# Patient Record
Sex: Male | Born: 1980 | Race: White | Hispanic: No
Health system: Midwestern US, Community
[De-identification: ages and names within clinical notes are randomized; demographics above are authoritative.]

## PROBLEM LIST (undated history)

## (undated) DIAGNOSIS — F319 Bipolar disorder, unspecified: Secondary | ICD-10-CM

## (undated) DIAGNOSIS — G8929 Other chronic pain: Secondary | ICD-10-CM

## (undated) DIAGNOSIS — F419 Anxiety disorder, unspecified: Secondary | ICD-10-CM

## (undated) DIAGNOSIS — M549 Dorsalgia, unspecified: Secondary | ICD-10-CM

---

## 2014-03-14 ENCOUNTER — Inpatient Hospital Stay: Admit: 2014-03-14 | Discharge: 2014-03-14 | Discharge status: Against Medical Advice

## 2014-03-14 NOTE — ED Notes (Signed)
Pt presents to ED with c/o lower back pain. Pt states that he has 3 bulging discs in lower back and needs to have surgery but can not afford to be off work to have it done.     Stefano GaulLarry Seairra Otani, RN  03/14/14 [phone]

## 2014-07-22 ENCOUNTER — Encounter (HOSPITAL_BASED_OUTPATIENT_CLINIC_OR_DEPARTMENT_OTHER): Payer: Self-pay | Admitting: *Deleted

## 2014-07-22 ENCOUNTER — Emergency Department (HOSPITAL_BASED_OUTPATIENT_CLINIC_OR_DEPARTMENT_OTHER): Payer: Self-pay

## 2014-07-22 ENCOUNTER — Emergency Department (HOSPITAL_BASED_OUTPATIENT_CLINIC_OR_DEPARTMENT_OTHER)
Admission: EM | Admit: 2014-07-22 | Discharge: 2014-07-22 | Disposition: A | Discharge status: Home / Self Care | Payer: Self-pay | Attending: Emergency Medicine | Admitting: Emergency Medicine

## 2014-07-22 DIAGNOSIS — Z79899 Other long term (current) drug therapy: Secondary | ICD-10-CM | POA: Insufficient documentation

## 2014-07-22 DIAGNOSIS — F319 Bipolar disorder, unspecified: Secondary | ICD-10-CM | POA: Insufficient documentation

## 2014-07-22 DIAGNOSIS — Z23 Encounter for immunization: Secondary | ICD-10-CM | POA: Insufficient documentation

## 2014-07-22 DIAGNOSIS — Y9389 Activity, other specified: Secondary | ICD-10-CM | POA: Insufficient documentation

## 2014-07-22 DIAGNOSIS — T148XXA Other injury of unspecified body region, initial encounter: Secondary | ICD-10-CM

## 2014-07-22 DIAGNOSIS — Y998 Other external cause status: Secondary | ICD-10-CM | POA: Insufficient documentation

## 2014-07-22 DIAGNOSIS — S060X9A Concussion with loss of consciousness of unspecified duration, initial encounter: Secondary | ICD-10-CM | POA: Insufficient documentation

## 2014-07-22 DIAGNOSIS — Y9289 Other specified places as the place of occurrence of the external cause: Secondary | ICD-10-CM | POA: Insufficient documentation

## 2014-07-22 DIAGNOSIS — F419 Anxiety disorder, unspecified: Secondary | ICD-10-CM | POA: Insufficient documentation

## 2014-07-22 DIAGNOSIS — S299XXA Unspecified injury of thorax, initial encounter: Secondary | ICD-10-CM | POA: Insufficient documentation

## 2014-07-22 DIAGNOSIS — G8929 Other chronic pain: Secondary | ICD-10-CM | POA: Insufficient documentation

## 2014-07-22 DIAGNOSIS — S0081XA Abrasion of other part of head, initial encounter: Secondary | ICD-10-CM | POA: Insufficient documentation

## 2014-07-22 HISTORY — DX: Other chronic pain: G89.29

## 2014-07-22 HISTORY — DX: Dorsalgia, unspecified: M54.9

## 2014-07-22 HISTORY — DX: Bipolar disorder, unspecified: F31.9

## 2014-07-22 HISTORY — DX: Anxiety disorder, unspecified: F41.9

## 2014-07-22 LAB — CBC WITH DIFFERENTIAL/PLATELET
Basophils Absolute: 0.1 K/uL (ref 0.0–0.1)
Basophils Relative: 1 % (ref 0–1)
Eosinophils Absolute: 0.4 K/uL (ref 0.0–0.7)
Eosinophils Relative: 5 % (ref 0–5)
HCT: 39.6 % (ref 39.0–52.0)
Hemoglobin: 12.9 g/dL — ABNORMAL LOW (ref 13.0–17.0)
Lymphocytes Relative: 14 % (ref 12–46)
Lymphs Abs: 1 K/uL (ref 0.7–4.0)
MCH: 28.5 pg (ref 26.0–34.0)
MCHC: 32.6 g/dL (ref 30.0–36.0)
MCV: 87.6 fL (ref 78.0–100.0)
Monocytes Absolute: 0.3 K/uL (ref 0.1–1.0)
Monocytes Relative: 5 % (ref 3–12)
Neutro Abs: 5.6 K/uL (ref 1.7–7.7)
Neutrophils Relative %: 75 % (ref 43–77)
Platelets: 262 K/uL (ref 150–400)
RBC: 4.52 MIL/uL (ref 4.22–5.81)
RDW: 13.5 % (ref 11.5–15.5)
WBC: 7.4 K/uL (ref 4.0–10.5)

## 2014-07-22 LAB — BASIC METABOLIC PANEL WITH GFR
Anion gap: 7 (ref 5–15)
BUN: 13 mg/dL (ref 6–20)
CO2: 28 mmol/L (ref 22–32)
Calcium: 9.1 mg/dL (ref 8.9–10.3)
Chloride: 103 mmol/L (ref 101–111)
Creatinine, Ser: 0.96 mg/dL (ref 0.61–1.24)
GFR calc Af Amer: >60 mL/min
GFR calc non Af Amer: >60 mL/min
Glucose, Bld: 94 mg/dL (ref 65–99)
Potassium: 4.3 mmol/L (ref 3.5–5.1)
Sodium: 138 mmol/L (ref 135–145)

## 2014-07-22 MED ORDER — HYDROMORPHONE HCL 1 MG/ML IJ SOLN
1.0000 mg | Freq: Once | INTRAMUSCULAR | Status: AC
  Administered 2014-07-22: 1 mg via INTRAVENOUS
  Filled 2014-07-22: qty 1

## 2014-07-22 MED ORDER — TETANUS-DIPHTH-ACELL PERTUSSIS 5-2.5-18.5 LF-MCG/0.5 IM SUSP
0.5000 mL | Freq: Once | INTRAMUSCULAR | Status: DC
  Filled 2014-07-22: qty 0.5

## 2014-07-22 NOTE — Discharge Instructions (Signed)
Assault, General °Assault includes any behavior, whether intentional or reckless, which results in bodily injury to another person and/or damage to property. Included in this would be any behavior, intentional or reckless, that by its nature would be understood (interpreted) by a reasonable person as intent to harm another person or to damage his/her property. Threats may be oral or written. They may be communicated through regular mail, computer, fax, or phone. These threats may be direct or implied. °FORMS OF ASSAULT INCLUDE: °· Physically assaulting a person. This includes physical threats to inflict physical harm as well as: °· Slapping. °· Hitting. °· Poking. °· Kicking. °· Punching. °· Pushing. °· Arson. °· Sabotage. °· Equipment vandalism. °· Damaging or destroying property. °· Throwing or hitting objects. °· Displaying a weapon or an object that appears to be a weapon in a threatening manner. °· Carrying a firearm of any kind. °· Using a weapon to harm someone. °· Using greater physical size/strength to intimidate another. °· Making intimidating or threatening gestures. °· Bullying. °· Hazing. °· Intimidating, threatening, hostile, or abusive language directed toward another person. °· It communicates the intention to engage in violence against that person. And it leads a reasonable person to expect that violent behavior may occur. °· Stalking another person. °IF IT HAPPENS AGAIN: °· Immediately call for emergency help (911 in [place] °· If someone poses clear and immediate danger to you, seek legal authorities to have a protective or restraining order put in place. °· Less threatening assaults can at least be reported to authorities. °STEPS TO TAKE IF A SEXUAL ASSAULT HAS HAPPENED °· Go to an area of safety. This may include a shelter or staying with a friend. Stay away from the area where you have been attacked. A large percentage of sexual assaults are caused by a friend, relative or associate. °· If  medications were given by your caregiver, take them as directed for the full length of time prescribed. °· Only take over-the-counter or prescription medicines for pain, discomfort, or fever as directed by your caregiver. °· If you have come in contact with a sexual disease, find out if you are to be tested again. If your caregiver is concerned about the HIV/AIDS virus, he/she may require you to have continued testing for several months. °· For the protection of your privacy, test results can not be given over the phone. Make sure you receive the results of your test. If your test results are not back during your visit, make an appointment with your caregiver to find out the results. Do not assume everything is normal if you have not heard from your caregiver or the medical facility. It is important for you to follow up on all of your test results. °· File appropriate papers with authorities. This is important in all assaults, even if it has occurred in a family or by a friend. °SEEK MEDICAL CARE IF: °· You have new problems because of your injuries. °· You have problems that may be because of the medicine you are taking, such as: °· Rash. °· Itching. °· Swelling. °· Trouble breathing. °· You develop belly (abdominal) pain, feel sick to your stomach (nausea) or are vomiting. °· You begin to run a temperature. °· You need supportive care or referral to a rape crisis center. These are centers with trained personnel who can help you get through this ordeal. °SEEK IMMEDIATE MEDICAL CARE IF: °· You are afraid of being threatened, beaten, or abused. In [place] call 911. °· You   receive new injuries related to abuse. °· You develop severe pain in any area injured in the assault or have any change in your condition that concerns you. °· You faint or lose consciousness. °· You develop chest pain or shortness of breath. °Document Released: 02/18/2005 Document Revised: 05/13/2011 Document Reviewed: 10/07/2007 °[employer]® Patient  Information ©2015 [employer]. This information is not intended to replace advice given to you by your health care provider. Make sure you discuss any questions you have with your health care provider. ° °Contusion °A contusion is a deep bruise. Contusions are the result of an injury that caused bleeding under the skin. The contusion may turn blue, purple, or yellow. Minor injuries will give you a painless contusion, but more severe contusions may stay painful and swollen for a few weeks.  °CAUSES  °A contusion is usually caused by a blow, trauma, or direct force to an area of the body. °SYMPTOMS  °· Swelling and redness of the injured area. °· Bruising of the injured area. °· Tenderness and soreness of the injured area. °· Pain. °DIAGNOSIS  °The diagnosis can be made by taking a history and physical exam. An X-ray, CT scan, or MRI may be needed to determine if there were any associated injuries, such as fractures. °TREATMENT  °Specific treatment will depend on what area of the body was injured. In general, the best treatment for a contusion is resting, icing, elevating, and applying cold compresses to the injured area. Over-the-counter medicines may also be recommended for pain control. Ask your caregiver what the best treatment is for your contusion. °HOME CARE INSTRUCTIONS  °· Put ice on the injured area. °¨ Put ice in a plastic bag. °¨ Place a towel between your skin and the bag. °¨ Leave the ice on for 15-20 minutes, 3-4 times a day, or as directed by your health care provider. °· Only take over-the-counter or prescription medicines for pain, discomfort, or fever as directed by your caregiver. Your caregiver may recommend avoiding anti-inflammatory medicines (aspirin, ibuprofen, and naproxen) for 48 hours because these medicines may increase bruising. °· Rest the injured area. °· If possible, elevate the injured area to reduce swelling. °SEEK IMMEDIATE MEDICAL CARE IF:  °· You have increased bruising or  swelling. °· You have pain that is getting worse. °· Your swelling or pain is not relieved with medicines. °MAKE SURE YOU:  °· Understand these instructions. °· Will watch your condition. °· Will get help right away if you are not doing well or get worse. °Document Released: 11/28/2004 Document Revised: 02/23/2013 Document Reviewed: 12/24/2010 °[employer]® Patient Information ©2015 [employer]. This information is not intended to replace advice given to you by your health care provider. Make sure you discuss any questions you have with your health care provider. ° °

## 2014-07-22 NOTE — ED Notes (Signed)
Pt sleeping with resp deep and reg. Arouses to spoken name, reports his pain cont 10/10. Pt sleeping with resp deep and reg while being medicated. Awakens to spoken name, verbalizes understanding of need for urine specimen. Urinal given to pt, will alert staff when he can void. Pt goes right back to sleep with resp deep and reg.

## 2014-07-22 NOTE — ED Notes (Signed)
Scott, security, notified of pt awaiting his ride in the waiting room.
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
## 2014-07-22 NOTE — ED Notes (Signed)
Pt returned from radiology.  Urine specimen requested.
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
## 2014-07-22 NOTE — ED Notes (Signed)
Urinal provided and pt informed that a urine specimen is needed.

## 2014-07-22 NOTE — ED Notes (Signed)
Patient transported to X-ray 
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
## 2014-07-23 ENCOUNTER — Encounter (HOSPITAL_BASED_OUTPATIENT_CLINIC_OR_DEPARTMENT_OTHER): Payer: Self-pay

## 2014-07-23 ENCOUNTER — Emergency Department (HOSPITAL_BASED_OUTPATIENT_CLINIC_OR_DEPARTMENT_OTHER)
Admission: EM | Admit: 2014-07-23 | Discharge: 2014-07-23 | Disposition: A | Discharge status: Home / Self Care | Payer: Self-pay | Attending: Emergency Medicine | Admitting: Emergency Medicine

## 2014-07-23 DIAGNOSIS — M7918 Myalgia, other site: Secondary | ICD-10-CM

## 2014-07-23 DIAGNOSIS — M791 Myalgia: Secondary | ICD-10-CM | POA: Insufficient documentation

## 2014-07-23 DIAGNOSIS — F0781 Postconcussional syndrome: Secondary | ICD-10-CM | POA: Insufficient documentation

## 2014-07-23 DIAGNOSIS — F419 Anxiety disorder, unspecified: Secondary | ICD-10-CM | POA: Insufficient documentation

## 2014-07-23 DIAGNOSIS — S0083XD Contusion of other part of head, subsequent encounter: Secondary | ICD-10-CM | POA: Insufficient documentation

## 2014-07-23 DIAGNOSIS — T148XXA Other injury of unspecified body region, initial encounter: Secondary | ICD-10-CM

## 2014-07-23 DIAGNOSIS — Z79899 Other long term (current) drug therapy: Secondary | ICD-10-CM | POA: Insufficient documentation

## 2014-07-23 DIAGNOSIS — G8929 Other chronic pain: Secondary | ICD-10-CM | POA: Insufficient documentation

## 2014-07-23 MED ORDER — ACETAMINOPHEN 325 MG PO TABS
650.0000 mg | ORAL_TABLET | Freq: Once | ORAL | Status: AC
  Administered 2014-07-23: 650 mg via ORAL
  Filled 2014-07-23: qty 2

## 2014-07-23 MED ORDER — CYCLOBENZAPRINE HCL 10 MG PO TABS: 10.0000 mg | ORAL_TABLET | Freq: Two times a day (BID) | ORAL | Status: AC | PRN

## 2014-07-23 NOTE — Discharge Instructions (Signed)
For pain control you may take up to 800mg  of ibuprofen (that is usually 4 over the counter pills)  3 times a day (take with food) and acetaminophen 975mg  (this is 3 over the counter pills) four times a day. Do not drink alcohol or combine with other medications that have acetaminophen as an ingredient (Read the labels!).    For breakthrough pain you may take Flexeril. Do not drink alcohol, drive or operate heavy machinery when taking Flexeril.  Do not participate in any sports or any activities that could result in head trauma until you are cleared by your pediatrician,  primary care physician or neurologist.   Do not hesitate to return to the emergency room for any new, worsening or concerning symptoms.  Please obtain primary care using resource guide below. Let them know that you were seen in the emergency room and that they will need to obtain records for further outpatient management.   Concussion A concussion is a brain injury. It is caused by:  A hit to the head.  A quick and sudden movement (jolt) of the head or neck. A concussion is usually not life threatening. Even so, it can cause serious problems. If you had a concussion before, you may have concussion-like problems after a hit to your head. HOME CARE General Instructions  Follow your doctor's directions carefully.  Take medicines only as told by your doctor.  Only take medicines your doctor says are safe.  Do not drink alcohol until your doctor says it is okay. Alcohol and some drugs can slow down healing. They can also put you at risk for further injury.  If you are having trouble remembering things, write them down.  Try to do one thing at a time if you get distracted easily. For example, do not watch TV while making dinner.  Talk to your family members or close friends when making important decisions.  Follow up with your doctor as told.  Watch your symptoms. Tell others to do the same. Serious problems can  sometimes happen after a concussion. Older adults are more likely to have these problems.  Tell your teachers, school nurse, school counselor, coach, Event organiserathletic trainer, or work Production designer, theatre/television/filmmanager about your concussion. Tell them about what you can or cannot do. They should watch to see if:  It gets even harder for you to pay attention or concentrate.  It gets even harder for you to remember things or learn new things.  You need more time than normal to finish things.  You become annoyed (irritable) more than before.  You are not able to deal with stress as well.  You have more problems than before.  Rest. Make sure you:  Get plenty of sleep at night.  Go to sleep early.  Go to bed at the same time every day. Try to wake up at the same time.  Rest during the day.  Take naps when you feel tired.  Limit activities where you have to think a lot or concentrate. These include:  Doing homework.  Doing work related to a job.  Watching TV.  Using the computer. Returning To Your Regular Activities Return to your normal activities slowly, not all at once. You must give your body and brain enough time to heal.   Do not play sports or do other athletic activities until your doctor says it is okay.  Ask your doctor when you can drive, ride a bicycle, or work other vehicles or machines. Never do these things  if you feel dizzy.  Ask your doctor about when you can return to work or school. Preventing Another Concussion It is very important to avoid another brain injury, especially before you have healed. In rare cases, another injury can lead to permanent brain damage, brain swelling, or death. The risk of this is greatest during the first 7-10 days after your injury. Avoid injuries by:   Wearing a seat belt when riding in a car.  Not drinking too much alcohol.  Avoiding activities that could lead to a second concussion (such as contact sports).  Wearing a helmet when doing activities  like:  Biking.  Skiing.  Skateboarding.  Skating.  Making your home safer by:  Removing things from the floor or stairways that could make you trip.  Using grab bars in bathrooms and handrails by stairs.  Placing non-slip mats on floors and in bathtubs.  Improve lighting in dark areas. GET HELP IF:  It gets even harder for you to pay attention or concentrate.  It gets even harder for you to remember things or learn new things.  You need more time than normal to finish things.  You become annoyed (irritable) more than before.  You are not able to deal with stress as well.  You have more problems than before.  You have problems keeping your balance.  You are not able to react quickly when you should. Get help if you have any of these problems for more than 2 weeks:   Lasting (chronic) headaches.  Dizziness or trouble balancing.  Feeling sick to your stomach (nausea).  Seeing (vision) problems.  Being affected by noises or light more than normal.  Feeling sad, low, down in the dumps, blue, gloomy, or empty (depressed).  Mood changes (mood swings).  Feeling of fear or nervousness about what may happen (anxiety).  Feeling annoyed.  Memory problems.  Problems concentrating or paying attention.  Sleep problems.  Feeling tired all the time. GET HELP RIGHT AWAY IF:   You have bad headaches or your headaches get worse.  You have weakness (even if it is in one hand, leg, or part of the face).  You have loss of feeling (numbness).  You feel off balance.  You keep throwing up (vomiting).  You feel tired.  One black center of your eye (pupil) is larger than the other.  You twitch or shake violently (convulse).  Your speech is not clear (slurred).  You are more confused, easily angered (agitated), or annoyed than before.  You have more trouble resting than before.  You are unable to recognize people or places.  You have neck pain.  It is  difficult to wake you up.  You have unusual behavior changes.  You pass out (lose consciousness). MAKE SURE YOU:   Understand these instructions.  Will watch your condition.  Will get help right away if you are not doing well or get worse. Document Released: 02/06/2009 Document Revised: 07/05/2013 Document Reviewed: 09/10/2012 [facility] Patient Information 2015 [place], [place]. This information is not intended to replace advice given to you by your health care provider. Make sure you discuss any questions you have with your health care provider.   Emergency Department Resource Guide 1) Find a Doctor and Pay Out of Pocket Although you won't have to find out who is covered by your insurance plan, it is a good idea to ask around and get recommendations. You will then need to call the office and see if the doctor you have  chosen will accept you as a new patient and what types of options they offer for patients who are self-pay. Some doctors offer discounts or will set up payment plans for their patients who do not have insurance, but you will need to ask so you aren't surprised when you get to your appointment.  2) Contact Your Local Health Department Not all health departments have doctors that can see patients for sick visits, but many do, so it is worth a call to see if yours does. If you don't know where your local health department is, you can check in your phone book. The CDC also has a tool to help you locate your state's health department, and many state websites also have listings of all of their local health departments.  3) Find a Walk-in Clinic If your illness is not likely to be very severe or complicated, you may want to try a walk in clinic. These are popping up all over the country in pharmacies, drugstores, and shopping centers. They're usually staffed by nurse practitioners or physician assistants that have been trained to treat common illnesses and complaints. They're usually  fairly quick and inexpensive. However, if you have serious medical issues or chronic medical problems, these are probably not your best option.  No Primary Care Doctor: - Call Health Connect at  [phone] - they can help you locate a primary care doctor that  accepts your insurance, provides certain services, etc. - Physician Referral Service- [phone]  Chronic Pain Problems: Organization         Address  Phone   Notes  [facility]  [phone] Patients need to be referred by their primary care doctor.   Medication Assistance: Organization         Address  Phone   Notes  [facility] Medication [facility] [address] [place] [place], [place] [address] [phone] --Must be a resident of [facility] [place] -- Must have NO insurance coverage whatsoever (no Medicaid/ Medicare, etc.) -- The pt. MUST have a primary care doctor that directs their care regularly and follows them in the community   [facility]  [phone]   [employer]  [phone]    Agencies that provide inexpensive medical care: Organization         Address  Phone   Notes  [facility]  [phone]   [facility]    [phone]   [facility] [address] [place], [place] [address] [phone]   [facility] [address] [place] [place] [phone]   Planned Parenthood    [phone]   [facility]    [phone]   [facility]  [address], [place] Phone:  [phone], Fax:  [phone] Hours of Operation:  9 am - 6 pm, M-F.  Also accepts Medicaid/Medicare and self-pay.  [facility]  [address], [place] Phone: [phone], Fax: [phone]. Hours of Operation:  8:30 am - 5:30 pm, M-F.  Also accepts Medicaid and self-pay.  [facility] [place] [address] [place] Phone: [phone]   [facility] [address] Natasha Bence [place], [place] [phone], Ext. [phone] Mondays & Thursdays: 7-9 AM.  First 15 patients are seen on a first come, first serve basis.    Medicaid-accepting [facility] Providers:  Organization  Address  Phone   Notes  [facility] [address], [place] [phone] Also accepts self-pay patients.  [facility] [address] Laurell Josephs [place], [place]  [phone]   [facility] [address], [place] [phone]   [facility] [address], [place] [phone]   Renaye Rakers [address], [place]   [phone] Only accepts [place] Access [place] patients after they have their name applied to their card.   Self-Pay (no insurance) in [facility]:  Organization         Address  Phone   Notes  Sickle Cell Patients, [facility] [address] [place], [place] [phone]   [facility] [address] [place], [place] [phone]   [facility]  [address], [place] [phone]   [facility]/Dr. Osei-Bonsu  [address], [place] or [address], [place] [phone] Phone number for both [place] and [place] locations is the same.  [facility]gent [facility] and [facility] [address], [place] [phone]   [facility] [address], [place] or [address] [phone]   [facility] [address], [place] [phone], phone; [phone], fax Sees patients 1st and 3rd Saturday of every month.  Must not qualify for public or private insurance (i.e. Medicaid, Medicare, [place] Health Choice, Veterans' Benefits)  Household income should be no more than 200% of the poverty level The clinic cannot treat you if  you are pregnant or think you are pregnant  Sexually transmitted diseases are not treated at the clinic.    Dental Care: Organization         Address  Phone  Notes  [facility] Department of [facility] [address] [place], [place] [phone] Accepts children up to age 50 who are enrolled in [place] or [place] Health Choice; pregnant women with a Medicaid card; and children who have applied for Medicaid or [place] Health Choice, but were declined, whose parents can pay a reduced fee at time of service.  [facility] Department of [facility]  [address], [place] [phone] Accepts children up to age 40 who are enrolled in [place] or [place] Health Choice; pregnant women with a Medicaid card; and children who have applied for Medicaid or  Health Choice, but were declined, whose parents can pay a reduced fee at time of service.  [facility]  [address] [place], [place] [phone] Patients are seen by appointment only. Walk-ins are not accepted. [facility] will see patients 5 years of age and older. Monday - Tuesday (8am-5pm) Most Wednesdays (8:30-5pm) $30 per visit, cash only  [facility] Adult [facility] Access [facility]  [address], [facility] [phone] Patients are seen by appointment only. Walk-ins are not accepted. [facility] will see patients 81 years of age and older. One Wednesday Evening (Monthly: Volunteer Based).  $30 per visit, cash only  [employer]  [phone] for adults; Children under age 73, call Graduate Pediatric Dentistry at [phone]. Children aged 49-14, please call [phone] to request a pediatric application.  Dental services are provided in all areas of dental care including fillings,  crowns and bridges, complete and partial dentures, implants, gum treatment, root canals, and extractions. Preventive care is also provided. Treatment is  provided to both adults and children. Patients are selected via a lottery and there is often a waiting list.   [facility] [address], [place]  [phone] [email or web address]   [facility] [address] [place], [place] [phone], Ext. [phone] Second and Fourth Thursday of each month, opens at 6:30 AM; Clinic ends at 9 AM.  Patients are seen on a first-come first-served basis, and a limited number are seen during each clinic.   [facility]  [address] Ether Griffins [place], [place] [phone]   Eligibility Requirements You must have lived in [place], [place], or [place] counties for at least the last three months.   You cannot be eligible for state or federal sponsored [place], including [employer], [place], or [employer].   You generally cannot be eligible for healthcare insurance through your employer.    How to apply: Eligibility screenings are held every Tuesday and Wednesday afternoon from 1:00 pm until 4:00 pm. You do not need an appointment for the interview!  [facility] [address] [place], [place] [phone]   [facility] Health Department  [phone]   [facility] Health Department  [phone]   [facility] Health Department  [phone]    Behavioral Health Resources in the Community: Intensive Outpatient Programs Organization         Address  Phone  Notes  [facility] [address] [place], [place] [phone]   [facility] [address], [place], [place] [phone]   [facility] [address], [place], [place]  [phone]   [facility] [address],  [place], [place] [phone] or [phone]   Substance Abuse Resources Organization         Address  Phone  Notes  [facility] Drug [facility]  [phone]   [facility]  [phone]   [facility]  [phone]   Floydene Flock  [phone]   Residential & Outpatient Substance Abuse Program  [phone]   Psychological Services Organization         Address  Phone  Notes  [facility]  [phone]   [facility]  [phone]   [facility] [address], [place] [phone] or [phone]    Mobile Crisis Teams Organization         Address  Phone  Notes  [facility], Mobile Crisis Care Unit  [phone]   [facility]  [address] [place], [place] [phone]   Doristine Locks [address] [place] [phone]    Self-Help/Support Groups Organization         Address  Phone             Notes  [facility] [place] - variety of support groups  336- [phone] Call for more information  Narcotics Anonymous (NA), [facility] [address], [employer] [place]  2 meetings at this location   Statistician         Address  Phone  Notes  [facility] [address],    [place]  [phone]   [facility]  [address] [place] [address], [place], [place] [phone]   [facility] [address] [place], [place] [phone] Admissions: 8am-3pm M-F  [facility] [address]    [place], [place] [phone]   [facility] [address] [place], [place], [place] [phone]   [facility] [address],  [place], [place] [phone]   [facility] - Intensive Outpatient [address] Laurell Josephs [address], [place], [place] [phone]   [facility] ([facility] [address] [place]  [place], [place] [phone] or [phone]   [facility] ([facility]) [address], [place], [place] [phone] Accepts Medicaid  [facility] [address],  [place] [phone] Substance Abuse/Addiction Treatment    [facility] Organization         Address  Phone  Notes  [facility]  [phone]   Angie Fava, PhD [address] Ervin Knack [place], [place]   [phone] or [phone]   [facility] Behavioral   [address] [place], [place] [phone]   [facility] [address] [place], [place] [phone] Insurance/Medicaid/sponsorship through [facility] and Families [address]                                    [place], [place] [phone] Therapy/tele-psych/case  [facility] [address], [place] [phone]    Dr. Lolly Mustache  [phone]   [facility]  [employer] [facility] Dept. 1) [address], [place] 2) [address], [place] 3)  [address], [place] [phone]  [phone]   [facility] Child Abuse Hotline [phone] or [phone] (After Hours)
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
## 2017-01-02 DEATH — deceased
